# Patient Record
Sex: Male | Born: 2000 | Race: Black or African American | Hispanic: No | Marital: Single | Smoking: Current every day smoker
Health system: Southern US, Community
[De-identification: ages and names within clinical notes are randomized; demographics above are authoritative.]

## PROBLEM LIST (undated history)

## (undated) DIAGNOSIS — Y249XXA Unspecified firearm discharge, undetermined intent, initial encounter: Secondary | ICD-10-CM

## (undated) DIAGNOSIS — I10 Essential (primary) hypertension: Secondary | ICD-10-CM

---

## 2019-02-08 ENCOUNTER — Ambulatory Visit: Payer: Self-pay

## 2019-02-08 NOTE — Telephone Encounter (Signed)
Will try home remedies for nausea, vomiting. Reviewed signs of dehydration. If these occur will go to ED. Reason for Disposition . MILD or MODERATE vomiting (e.g., 1 - 5 times / day)  Answer Assessment - Initial Assessment Questions 1. VOMITING SEVERITY: "How many times have you vomited in the past 24 hours?"     - MILD:  1 - 2 times/day    - MODERATE: 3 - 5 times/day, decreased oral intake without significant weight loss or symptoms of dehydration    - SEVERE: 6 or more times/day, vomits everything or nearly everything, with significant weight loss, symptoms of dehydration      6 2. ONSET: "When did the vomiting begin?"      Started Saturday 3. FLUIDS: "What fluids or food have you vomited up today?" "Have you been able to keep any fluids down?"     No  4. ABDOMINAL PAIN: "Are your having any abdominal pain?" If yes : "How bad is it and what does it feel like?" (e.g., crampy, dull, intermittent, constant)      Yes 5. DIARRHEA: "Is there any diarrhea?" If so, ask: "How many times today?"      No 6. CONTACTS: "Is there anyone else in the family with the same symptoms?"      No 7. CAUSE: "What do you think is causing your vomiting?"     Unsure 8. HYDRATION STATUS: "Any signs of dehydration?" (e.g., dry mouth [not only dry lips], too weak to stand) "When did you last urinate?"     Urine dark yellow 9. OTHER SYMPTOMS: "Do you have any other symptoms?" (e.g., fever, headache, vertigo, vomiting blood or coffee grounds, recent head injury)     No 10. PREGNANCY: "Is there any chance you are pregnant?" "When was your last menstrual period?"       n/a  Protocols used: [facility]

## 2019-02-09 ENCOUNTER — Encounter (HOSPITAL_COMMUNITY): Payer: Self-pay

## 2019-02-09 ENCOUNTER — Emergency Department (HOSPITAL_COMMUNITY)
Admission: EM | Admit: 2019-02-09 | Discharge: 2019-02-09 | Disposition: A | Discharge status: Home / Self Care | Payer: Self-pay | Attending: Emergency Medicine | Admitting: Emergency Medicine

## 2019-02-09 ENCOUNTER — Other Ambulatory Visit: Payer: Self-pay

## 2019-02-09 DIAGNOSIS — F17210 Nicotine dependence, cigarettes, uncomplicated: Secondary | ICD-10-CM | POA: Insufficient documentation

## 2019-02-09 DIAGNOSIS — R112 Nausea with vomiting, unspecified: Secondary | ICD-10-CM | POA: Insufficient documentation

## 2019-02-09 DIAGNOSIS — R1084 Generalized abdominal pain: Secondary | ICD-10-CM | POA: Insufficient documentation

## 2019-02-09 LAB — URINALYSIS, ROUTINE W REFLEX MICROSCOPIC
Bacteria, UA: NONE SEEN
Bilirubin Urine: NEGATIVE
Glucose, UA: NEGATIVE mg/dL
Ketones, ur: 20 mg/dL — AB
Nitrite: NEGATIVE
Protein, ur: 100 mg/dL — AB
Specific Gravity, Urine: 1.026 (ref 1.005–1.030)
pH: 6 (ref 5.0–8.0)

## 2019-02-09 LAB — BASIC METABOLIC PANEL
Anion gap: 14 (ref 5–15)
BUN: 24 mg/dL — ABNORMAL HIGH (ref 6–20)
CO2: 26 mmol/L (ref 22–32)
Calcium: 8.8 mg/dL — ABNORMAL LOW (ref 8.9–10.3)
Chloride: 101 mmol/L (ref 98–111)
Creatinine, Ser: 1.2 mg/dL (ref 0.61–1.24)
GFR calc Af Amer: >60 mL/min (ref >60)
GFR calc non Af Amer: >60 mL/min (ref >60)
Glucose, Bld: 111 mg/dL — ABNORMAL HIGH (ref 70–99)
Potassium: 3.5 mmol/L (ref 3.5–5.1)
Sodium: 141 mmol/L (ref 135–145)

## 2019-02-09 LAB — COMPREHENSIVE METABOLIC PANEL
ALT: 19 U/L (ref 0–44)
AST: 17 U/L (ref 15–41)
Albumin: 5.8 g/dL — ABNORMAL HIGH (ref 3.5–5.0)
Alkaline Phosphatase: 72 U/L (ref 38–126)
Anion gap: 19 — ABNORMAL HIGH (ref 5–15)
BUN: 28 mg/dL — ABNORMAL HIGH (ref 6–20)
CO2: 27 mmol/L (ref 22–32)
Calcium: 9.7 mg/dL (ref 8.9–10.3)
Chloride: 93 mmol/L — ABNORMAL LOW (ref 98–111)
Creatinine, Ser: 1.42 mg/dL — ABNORMAL HIGH (ref 0.61–1.24)
GFR calc Af Amer: >60 mL/min (ref >60)
GFR calc non Af Amer: >60 mL/min (ref >60)
Glucose, Bld: 124 mg/dL — ABNORMAL HIGH (ref 70–99)
Potassium: 3.3 mmol/L — ABNORMAL LOW (ref 3.5–5.1)
Sodium: 139 mmol/L (ref 135–145)
Total Bilirubin: 1.6 mg/dL — ABNORMAL HIGH (ref 0.3–1.2)
Total Protein: 10.2 g/dL — ABNORMAL HIGH (ref 6.5–8.1)

## 2019-02-09 LAB — CBC
HCT: 57.9 % — ABNORMAL HIGH (ref 39.0–52.0)
Hemoglobin: 19.7 g/dL — ABNORMAL HIGH (ref 13.0–17.0)
MCH: 31.5 pg (ref 26.0–34.0)
MCHC: 34 g/dL (ref 30.0–36.0)
MCV: 92.6 fL (ref 80.0–100.0)
Platelets: 300 10*3/uL (ref 150–400)
RBC: 6.25 MIL/uL — ABNORMAL HIGH (ref 4.22–5.81)
RDW: 11.9 % (ref 11.5–15.5)
WBC: 8.9 10*3/uL (ref 4.0–10.5)
nRBC: 0 % (ref 0.0–0.2)

## 2019-02-09 LAB — LIPASE, BLOOD: Lipase: 32 U/L (ref 11–51)

## 2019-02-09 MED ORDER — SODIUM CHLORIDE 0.9 % IV BOLUS
1000.0000 mL | Freq: Once | INTRAVENOUS | Status: AC
  Administered 2019-02-09: 1000 mL via INTRAVENOUS

## 2019-02-09 MED ORDER — POTASSIUM CHLORIDE CRYS ER 20 MEQ PO TBCR
40.0000 meq | EXTENDED_RELEASE_TABLET | Freq: Once | ORAL | Status: AC
  Administered 2019-02-09: 12:00:00 40 meq via ORAL
  Filled 2019-02-09: qty 2

## 2019-02-09 MED ORDER — SODIUM CHLORIDE 0.9 % IV BOLUS
1000.0000 mL | Freq: Once | INTRAVENOUS | Status: AC
  Administered 2019-02-09: 10:00:00 1000 mL via INTRAVENOUS

## 2019-02-09 MED ORDER — PANTOPRAZOLE SODIUM 40 MG IV SOLR
40.0000 mg | Freq: Once | INTRAVENOUS | Status: AC
  Administered 2019-02-09: 10:00:00 40 mg via INTRAVENOUS
  Filled 2019-02-09: qty 40

## 2019-02-09 MED ORDER — ONDANSETRON 4 MG PO TBDP: 4.0000 mg | ORAL_TABLET | Freq: Three times a day (TID) | ORAL | 0 refills | Status: AC | PRN

## 2019-02-09 MED ORDER — ONDANSETRON HCL 4 MG/2ML IJ SOLN
4.0000 mg | Freq: Once | INTRAMUSCULAR | Status: AC
  Administered 2019-02-09: 10:00:00 4 mg via INTRAVENOUS
  Filled 2019-02-09: qty 2

## 2019-02-09 MED ORDER — SODIUM CHLORIDE 0.9% FLUSH: 3.0000 mL | Freq: Once | INTRAVENOUS | Status: DC

## 2019-02-09 MED ORDER — DICYCLOMINE HCL 10 MG/ML IM SOLN
20.0000 mg | Freq: Once | INTRAMUSCULAR | Status: AC
  Administered 2019-02-09: 20 mg via INTRAMUSCULAR
  Filled 2019-02-09: qty 2

## 2019-02-09 MED ORDER — DICYCLOMINE HCL 20 MG PO TABS: 20.0000 mg | ORAL_TABLET | Freq: Two times a day (BID) | ORAL | 0 refills | Status: AC | PRN

## 2019-02-09 NOTE — ED Triage Notes (Signed)
Patient c/o abdominal pain and emesis x 4 days. Patient denies any diarrhea.

## 2019-02-09 NOTE — Discharge Instructions (Signed)
Follow up with your primary care doctor to discuss your hospital visit. Continue to hydrate orally with small sips of fluids throughout the day. Use Zofran as directed for nausea & vomiting.   SEEK IMMEDIATE MEDICAL ATTENTION IF: You begin having localized abdominal pain that does not go away or becomes severe You develop a fever Repeated vomiting occurs (multiple uncontrollable episodes despite medication) or you are unable to keep fluids down Blood is being passed in stools or vomit (bright red or black tarry stools).  New or worsening symptoms develop or you have any additional concerns.

## 2019-02-09 NOTE — ED Notes (Signed)
Pt. Has a urinal at the bedside. Pt. Aware of urine specimen. Nurse aware.

## 2019-02-09 NOTE — ED Provider Notes (Signed)
[place] DEPT Provider Note   CSN: [id number] Arrival date & time: 02/09/19  0848    History   Chief Complaint Chief Complaint  Patient presents with  . Emesis  . Abdominal Pain    HPI Warren Kelley is a 18 y.o. male.     The history is provided by the patient and medical records. No language interpreter was used.  Emesis Associated symptoms: abdominal pain   Associated symptoms: no diarrhea   Abdominal Pain Associated symptoms: nausea and vomiting   Associated symptoms: no constipation and no diarrhea     History reviewed. No pertinent past medical history.  There are no active problems to display for this patient.   History reviewed. No pertinent surgical history.    Warren Kelley is a 18 y.o. male who presents to the Emergency Department complaining of persistent generalized abdominal pain for the last 4 days.  Associated with nausea and vomiting.  Patient states that he has had about 2-3 episodes of emesis a day, mostly after he tries to eat or drink something.  Patient states that this happens to him every couple of months, but typically will last 1 day.  It has been an ongoing issue for him for several years.  He actually states that he has been doing this since he was 18 years old, but has never seen a doctor for it.  He denies any diarrhea, constipation or blood in the stool.  No fever.  No cough, congestion, shortness of breath, chest pain, back pain or urinary symptoms.  He tried to take Pepto-Bismol, but threw it up shortly after.    Home Medications    Prior to Admission medications   Medication Sig Start Date End Date Taking? Authorizing Provider  diphenhydrAMINE (BENADRYL) 25 MG tablet Take 25 mg by mouth every 6 (six) hours as needed for allergies.   Yes [provider]  dicyclomine (BENTYL) 20 MG tablet Take 1 tablet (20 mg total) by mouth 2 (two) times daily as needed (Abdominal pain). 02/09/19   Kayzlee Wirtanen, Ozella Almond, PA-C  ondansetron (ZOFRAN ODT) 4 MG disintegrating tablet Take 1 tablet (4 mg total) by mouth every 8 (eight) hours as needed for nausea or vomiting. 02/09/19   Atleigh Gruen, Ozella Almond, PA-C    Family History Family History  Family history unknown: Yes    Social History Social History   Tobacco Use  . Smoking status: Current Some Day Smoker    Types: Cigarettes  . Smokeless tobacco: Never Used  Substance Use Topics  . Alcohol use: Yes    Comment: occasionally  . Drug use: Yes    Types: Marijuana    Comment: 2-3 days a wek     Allergies   Penicillins, Fish allergy, and Ciprofloxacin   Review of Systems Review of Systems  Gastrointestinal: Positive for abdominal pain, nausea and vomiting. Negative for blood in stool, constipation and diarrhea.  All other systems reviewed and are negative.    Physical Exam Updated Vital Signs BP 135/90   Pulse 69   Temp 98.6 F (37 C) (Oral)   Resp 15   Ht 5\' 6"  (1.676 m)   Wt 70.3 kg   SpO2 100%   BMI 25.02 kg/m   Physical Exam Vitals signs and nursing note reviewed.  Constitutional:      General: He is not in acute distress.    Appearance: He is well-developed.     Comments: Non-toxic appearing.  HENT:  Head: Normocephalic and atraumatic.  Neck:     Musculoskeletal: Neck supple.  Cardiovascular:     Rate and Rhythm: Normal rate and regular rhythm.     Heart sounds: Normal heart sounds. No murmur.  Pulmonary:     Effort: Pulmonary effort is normal. No respiratory distress.     Breath sounds: Normal breath sounds.  Abdominal:     General: There is no distension.     Palpations: Abdomen is soft.     Comments: Mild generalized abdominal tenderness. No rebound or guarding. No focal tenderness at McBurney's. Negative Murphy's. No flank or CVA tenderness.  Skin:    General: Skin is warm and dry.  Neurological:     Mental Status: He is alert and oriented to person, place, and time.      ED Treatments /  Results  Labs (all labs ordered are listed, but only abnormal results are displayed) Labs Reviewed  COMPREHENSIVE METABOLIC PANEL - Abnormal; Notable for the following components:      Result Value   Potassium 3.3 (*)    Chloride 93 (*)    Glucose, Bld 124 (*)    BUN 28 (*)    Creatinine, Ser 1.42 (*)    Total Protein 10.2 (*)    Albumin 5.8 (*)    Total Bilirubin 1.6 (*)    Anion gap 19 (*)    All other components within normal limits  CBC - Abnormal; Notable for the following components:   RBC 6.25 (*)    Hemoglobin 19.7 (*)    HCT 57.9 (*)    All other components within normal limits  URINALYSIS, ROUTINE W REFLEX MICROSCOPIC - Abnormal; Notable for the following components:   Hgb urine dipstick SMALL (*)    Ketones, ur 20 (*)    Protein, ur 100 (*)    Leukocytes,Ua SMALL (*)    All other components within normal limits  BASIC METABOLIC PANEL - Abnormal; Notable for the following components:   Glucose, Bld 111 (*)    BUN 24 (*)    Calcium 8.8 (*)    All other components within normal limits  LIPASE, BLOOD    EKG None  Radiology No results found.  Procedures Procedures (including critical care time)  Medications Ordered in ED Medications  sodium chloride 0.9 % bolus 1,000 mL (0 mLs Intravenous Stopped 02/09/19 1136)  ondansetron (ZOFRAN) injection 4 mg (4 mg Intravenous Given 02/09/19 0958)  dicyclomine (BENTYL) injection 20 mg (20 mg Intramuscular Given 02/09/19 1000)  pantoprazole (PROTONIX) injection 40 mg (40 mg Intravenous Given 02/09/19 1004)  sodium chloride 0.9 % bolus 1,000 mL (0 mLs Intravenous Stopped 02/09/19 1321)  potassium chloride SA (K-DUR) CR tablet 40 mEq (40 mEq Oral Given 02/09/19 1147)     Initial Impression / Assessment and Plan / ED Course  I have reviewed the triage vital signs and the nursing notes.  Pertinent labs & imaging results that were available during my care of the patient were reviewed by me and considered in my medical decision  making (see chart for details).       Warren Kelley is a 18 y.o. male who presents to ED for nausea and vomiting for the last 4 days.  On exam, patient is afebrile, hemodynamically stable with mild generalized abdominal tenderness with no focality, rebound or guarding.  Labs reviewed: Hypokalemia which was replenished in ED.  Appears quite dehydrated with anion gap of 19 and AKI with cr at 1.42 as well.  Given  2 L of fluid.  Chemistry panel rechecked and much improved with normal creatinine and anion gap. On re-evaluation, patient feels much improved and is now tolerating PO. Repeat abdominal exam non-surgical. PCP follow up encouraged. Home care instructions and return precautions discussed. All questions answered.    Final Clinical Impressions(s) / ED Diagnoses   Final diagnoses:  Non-intractable vomiting with nausea, unspecified vomiting type    ED Discharge Orders         Ordered    ondansetron (ZOFRAN ODT) 4 MG disintegrating tablet  Every 8 hours PRN     02/09/19 1434    dicyclomine (BENTYL) 20 MG tablet  2 times daily PRN     02/09/19 1434           Aj Crunkleton, Chase PicketJaime Pilcher, PA-C 02/09/19 1442    Samuel JesterMcManus, Kathleen, DO 02/10/19 [id number]

## 2019-02-10 ENCOUNTER — Other Ambulatory Visit: Payer: Self-pay

## 2019-02-10 ENCOUNTER — Encounter (HOSPITAL_COMMUNITY): Payer: Self-pay | Admitting: Emergency Medicine

## 2019-02-10 ENCOUNTER — Emergency Department (HOSPITAL_COMMUNITY)
Admission: EM | Admit: 2019-02-10 | Discharge: 2019-02-11 | Disposition: A | Discharge status: Home / Self Care | Payer: Self-pay | Attending: Emergency Medicine | Admitting: Emergency Medicine

## 2019-02-10 DIAGNOSIS — F1210 Cannabis abuse, uncomplicated: Secondary | ICD-10-CM | POA: Insufficient documentation

## 2019-02-10 DIAGNOSIS — F17210 Nicotine dependence, cigarettes, uncomplicated: Secondary | ICD-10-CM | POA: Insufficient documentation

## 2019-02-10 DIAGNOSIS — R197 Diarrhea, unspecified: Secondary | ICD-10-CM | POA: Insufficient documentation

## 2019-02-10 DIAGNOSIS — R112 Nausea with vomiting, unspecified: Secondary | ICD-10-CM | POA: Insufficient documentation

## 2019-02-10 LAB — COMPREHENSIVE METABOLIC PANEL
ALT: 17 U/L (ref 0–44)
AST: 22 U/L (ref 15–41)
Albumin: 5.1 g/dL — ABNORMAL HIGH (ref 3.5–5.0)
Alkaline Phosphatase: 67 U/L (ref 38–126)
Anion gap: 11 (ref 5–15)
BUN: 19 mg/dL (ref 6–20)
CO2: 27 mmol/L (ref 22–32)
Calcium: 9.9 mg/dL (ref 8.9–10.3)
Chloride: 100 mmol/L (ref 98–111)
Creatinine, Ser: 1.09 mg/dL (ref 0.61–1.24)
GFR calc Af Amer: >60 mL/min (ref >60)
GFR calc non Af Amer: >60 mL/min (ref >60)
Glucose, Bld: 119 mg/dL — ABNORMAL HIGH (ref 70–99)
Potassium: 3.8 mmol/L (ref 3.5–5.1)
Sodium: 138 mmol/L (ref 135–145)
Total Bilirubin: 1.4 mg/dL — ABNORMAL HIGH (ref 0.3–1.2)
Total Protein: 9 g/dL — ABNORMAL HIGH (ref 6.5–8.1)

## 2019-02-10 LAB — CBC
HCT: 53.5 % — ABNORMAL HIGH (ref 39.0–52.0)
Hemoglobin: 18.1 g/dL — ABNORMAL HIGH (ref 13.0–17.0)
MCH: 31.7 pg (ref 26.0–34.0)
MCHC: 33.8 g/dL (ref 30.0–36.0)
MCV: 93.7 fL (ref 80.0–100.0)
Platelets: 281 10*3/uL (ref 150–400)
RBC: 5.71 MIL/uL (ref 4.22–5.81)
RDW: 11.9 % (ref 11.5–15.5)
WBC: 10.6 10*3/uL — ABNORMAL HIGH (ref 4.0–10.5)
nRBC: 0 % (ref 0.0–0.2)

## 2019-02-10 LAB — LIPASE, BLOOD: Lipase: 49 U/L (ref 11–51)

## 2019-02-10 MED ORDER — ALUM & MAG HYDROXIDE-SIMETH 200-200-20 MG/5ML PO SUSP
30.0000 mL | Freq: Once | ORAL | Status: AC
  Administered 2019-02-11: 30 mL via ORAL
  Filled 2019-02-10: qty 30

## 2019-02-10 MED ORDER — SODIUM CHLORIDE 0.9% FLUSH: 3.0000 mL | Freq: Once | INTRAVENOUS | Status: DC

## 2019-02-10 MED ORDER — DIPHENHYDRAMINE HCL 50 MG/ML IJ SOLN
25.0000 mg | Freq: Once | INTRAMUSCULAR | Status: AC
  Administered 2019-02-11: 25 mg via INTRAMUSCULAR
  Filled 2019-02-10: qty 1

## 2019-02-10 MED ORDER — PROCHLORPERAZINE EDISYLATE 10 MG/2ML IJ SOLN
10.0000 mg | Freq: Once | INTRAMUSCULAR | Status: AC
  Administered 2019-02-11: 10 mg via INTRAMUSCULAR
  Filled 2019-02-10: qty 2

## 2019-02-10 NOTE — ED Triage Notes (Addendum)
Patient here from home with complaints of n/v 6 days. Denies abd pain. Reports that he was seen yesterday for same. Hx of parasitic infection.

## 2019-02-11 LAB — URINALYSIS, ROUTINE W REFLEX MICROSCOPIC
Bilirubin Urine: NEGATIVE
Glucose, UA: NEGATIVE mg/dL
Ketones, ur: 5 mg/dL — AB
Nitrite: NEGATIVE
Protein, ur: 100 mg/dL — AB
Specific Gravity, Urine: 1.024 (ref 1.005–1.030)
pH: 6 (ref 5.0–8.0)

## 2019-02-11 MED ORDER — PROMETHAZINE HCL 25 MG RE SUPP: 25.0000 mg | Freq: Four times a day (QID) | RECTAL | 0 refills | Status: AC | PRN

## 2019-02-11 NOTE — ED Provider Notes (Signed)
[facility]-EMERGENCY DEPT Provider Note   CSN: [id number] Arrival date & time: 02/10/19  2116    History   Chief Complaint Chief Complaint  Patient presents with  . Nausea  . Emesis    HPI Warren Kelley is a 18 y.o. male.     18 yo M with a chief complaint of nausea and vomiting.  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  Mom states that he has had about 6 times this year and this time suction been going on for about 6 days.  Having trouble keeping anything down.  Having some diarrhea with it as well.  Has lost about 6 pounds this week.  No fevers no abdominal pain.  No blood in his stool.  Was seen in the emergency department yesterday and had an acute kidney injury received 2 L of IV fluids with improvement was discharged home.  Patient been taking his Zofran but without improvement.  The history is provided by the patient.  Emesis Severity:  Moderate Duration:  1 week Timing:  Constant Quality:  Stomach contents Able to tolerate:  Liquids Progression:  Worsening Chronicity:  Recurrent Recent urination:  Normal Relieved by:  Nothing Worsened by:  Nothing Ineffective treatments:  None tried Associated symptoms: no abdominal pain, no arthralgias, no chills, no diarrhea, no fever, no headaches and no myalgias     History reviewed. No pertinent past medical history.  There are no active problems to display for this patient.   History reviewed. No pertinent surgical history.      Home Medications    Prior to Admission medications   Medication Sig Start Date End Date Taking? Authorizing Provider  diphenhydrAMINE (BENADRYL) 25 MG tablet Take 25 mg by mouth every 6 (six) hours as needed for allergies.   Yes [provider]  dicyclomine (BENTYL) 20 MG tablet Take 1 tablet (20 mg total) by mouth 2 (two) times daily as needed (Abdominal pain). 02/09/19   Ward,  Chase PicketJaime Pilcher, PA-C  ondansetron (ZOFRAN ODT) 4 MG disintegrating tablet Take 1 tablet (4 mg total) by mouth every 8 (eight) hours as needed for nausea or vomiting. 02/09/19   Ward, Chase PicketJaime Pilcher, PA-C  promethazine (PHENERGAN) 25 MG suppository Place 1 suppository (25 mg total) rectally every 6 (six) hours as needed for nausea or vomiting. 02/11/19   Melene PlanFloyd, Ladarius Seubert, DO    Family History Family History  Family history unknown: Yes    Social History Social History   Tobacco Use  . Smoking status: Current Some Day Smoker    Types: Cigarettes  . Smokeless tobacco: Never Used  Substance Use Topics  . Alcohol use: Yes    Comment: occasionally  . Drug use: Yes    Types: Marijuana    Comment: 2-3 days a wek     Allergies   Penicillins, Fish allergy, and Ciprofloxacin   Review of Systems Review of Systems  Constitutional: Negative for chills and fever.  HENT: Negative for congestion and facial swelling.   Eyes: Negative for discharge and visual disturbance.  Respiratory: Negative for shortness of breath.   Cardiovascular: Negative for chest pain and palpitations.  Gastrointestinal: Positive for vomiting. Negative for abdominal pain and diarrhea.  Musculoskeletal: Negative for arthralgias and myalgias.  Skin: Negative for color change and rash.  Neurological: Negative for tremors, syncope and headaches.  Psychiatric/Behavioral: Negative for confusion and dysphoric mood.  Physical Exam Updated Vital Signs BP (!) 143/109 (BP Location: Left Arm)   Pulse 84   Temp 99.1 F (37.3 C) (Oral)   Resp 20   SpO2 99%   Physical Exam Vitals signs and nursing note reviewed.  Constitutional:      Appearance: He is well-developed.  HENT:     Head: Normocephalic and atraumatic.  Eyes:     Pupils: Pupils are equal, round, and reactive to light.  Neck:     Musculoskeletal: Normal range of motion and neck supple.     Vascular: No JVD.  Cardiovascular:     Rate and Rhythm: Normal rate  and regular rhythm.     Heart sounds: No murmur. No friction rub. No gallop.   Pulmonary:     Effort: No respiratory distress.     Breath sounds: No wheezing.  Abdominal:     General: There is no distension.     Tenderness: There is no guarding or rebound.  Musculoskeletal: Normal range of motion.  Skin:    Coloration: Skin is not pale.     Findings: No rash.  Neurological:     Mental Status: He is alert and oriented to person, place, and time.  Psychiatric:        Behavior: Behavior normal.      ED Treatments / Results  Labs (all labs ordered are listed, but only abnormal results are displayed) Labs Reviewed  COMPREHENSIVE METABOLIC PANEL - Abnormal; Notable for the following components:      Result Value   Glucose, Bld 119 (*)    Total Protein 9.0 (*)    Albumin 5.1 (*)    Total Bilirubin 1.4 (*)    All other components within normal limits  CBC - Abnormal; Notable for the following components:   WBC 10.6 (*)    Hemoglobin 18.1 (*)    HCT 53.5 (*)    All other components within normal limits  URINALYSIS, ROUTINE W REFLEX MICROSCOPIC - Abnormal; Notable for the following components:   Color, Urine AMBER (*)    Hgb urine dipstick SMALL (*)    Ketones, ur 5 (*)    Protein, ur 100 (*)    Leukocytes,Ua TRACE (*)    Bacteria, UA RARE (*)    All other components within normal limits  LIPASE, BLOOD    EKG None  Radiology No results found.  Procedures Procedures (including critical care time)  Medications Ordered in ED Medications  sodium chloride flush (NS) 0.9 % injection 3 mL (0 mLs Intravenous Hold 02/10/19 2300)  prochlorperazine (COMPAZINE) injection 10 mg (10 mg Intramuscular Given 02/11/19 0005)  diphenhydrAMINE (BENADRYL) injection 25 mg (25 mg Intramuscular Given 02/11/19 0005)  alum & mag hydroxide-simeth (MAALOX/MYLANTA) 200-200-20 MG/5ML suspension 30 mL (30 mLs Oral Given 02/11/19 0004)     Initial Impression / Assessment and Plan / ED Course  I  have reviewed the triage vital signs and the nursing notes.  Pertinent labs & imaging results that were available during my care of the patient were reviewed by me and considered in my medical decision making (see chart for details).        18 yo M with a chief complaints of nausea vomiting and diarrhea.  Going for the past week.  Patient was seen yesterday with an acute kidney injury given 2 L of fluids with improvement.  Today his renal function is continue to be improved.  He is well-appearing nontoxic.  Will give Compazine Benadryl oral trial and  reassess.  Able to tolerate by mouth without difficulty.  Nausea significantly improved.  Discharge home.  Trial of Phenergan suppositories.  12:37 AM:  I have discussed the diagnosis/risks/treatment options with the patient and believe the pt to be eligible for discharge home to follow-up with GI. We also discussed returning to the ED immediately if new or worsening sx occur. We discussed the sx which are most concerning (e.g., sudden worsening pain, fever, inability to tolerate by mouth) that necessitate immediate return. Medications administered to the patient during their visit and any new prescriptions provided to the patient are listed below.  Medications given during this visit Medications  sodium chloride flush (NS) 0.9 % injection 3 mL (0 mLs Intravenous Hold 02/10/19 2300)  prochlorperazine (COMPAZINE) injection 10 mg (10 mg Intramuscular Given 02/11/19 0005)  diphenhydrAMINE (BENADRYL) injection 25 mg (25 mg Intramuscular Given 02/11/19 0005)  alum & mag hydroxide-simeth (MAALOX/MYLANTA) 200-200-20 MG/5ML suspension 30 mL (30 mLs Oral Given 02/11/19 0004)     The patient appears reasonably screen and/or stabilized for discharge and I doubt any other medical condition or other [facility] requiring further screening, evaluation, or treatment in the ED at this time prior to discharge.    Final Clinical Impressions(s) / ED Diagnoses   Final  diagnoses:  Nausea vomiting and diarrhea    ED Discharge Orders         Ordered    promethazine (PHENERGAN) 25 MG suppository  Every 6 hours PRN     02/11/19 0033           Melene PlanFloyd, Nylan Nevel, DO 02/11/19 0037

## 2019-02-11 NOTE — Discharge Instructions (Signed)
Try zantac or pepcid twice a day.  Try to avoid things that may make this worse, most commonly these are spicy foods tomato based products fatty foods chocolate and peppermint.  Alcohol and tobacco can also make this worse.  Return to the emergency department for sudden worsening pain fever or inability to eat or drink. ° °

## 2019-02-11 NOTE — ED Notes (Signed)
Patient was able to keep down Maalox

## 2019-10-06 ENCOUNTER — Other Ambulatory Visit: Payer: Self-pay

## 2019-10-06 ENCOUNTER — Emergency Department (HOSPITAL_COMMUNITY)
Admission: EM | Admit: 2019-10-06 | Discharge: 2019-10-06 | Disposition: A | Discharge status: Home / Self Care | Payer: No Typology Code available for payment source | Attending: Emergency Medicine | Admitting: Emergency Medicine

## 2019-10-06 ENCOUNTER — Emergency Department (HOSPITAL_COMMUNITY): Payer: No Typology Code available for payment source

## 2019-10-06 DIAGNOSIS — Y999 Unspecified external cause status: Secondary | ICD-10-CM | POA: Diagnosis not present

## 2019-10-06 DIAGNOSIS — R52 Pain, unspecified: Secondary | ICD-10-CM | POA: Diagnosis present

## 2019-10-06 DIAGNOSIS — Z79899 Other long term (current) drug therapy: Secondary | ICD-10-CM | POA: Insufficient documentation

## 2019-10-06 DIAGNOSIS — F17210 Nicotine dependence, cigarettes, uncomplicated: Secondary | ICD-10-CM | POA: Diagnosis not present

## 2019-10-06 DIAGNOSIS — M545 Low back pain, unspecified: Secondary | ICD-10-CM

## 2019-10-06 DIAGNOSIS — R0789 Other chest pain: Secondary | ICD-10-CM

## 2019-10-06 DIAGNOSIS — Y92410 Unspecified street and highway as the place of occurrence of the external cause: Secondary | ICD-10-CM | POA: Insufficient documentation

## 2019-10-06 MED ORDER — IBUPROFEN 600 MG PO TABS: 600.0000 mg | ORAL_TABLET | Freq: Four times a day (QID) | ORAL | 0 refills | Status: AC | PRN

## 2019-10-06 MED ORDER — CYCLOBENZAPRINE HCL 10 MG PO TABS
10.0000 mg | ORAL_TABLET | Freq: Once | ORAL | Status: AC
  Administered 2019-10-06: 09:00:00 10 mg via ORAL
  Filled 2019-10-06: qty 1

## 2019-10-06 MED ORDER — IBUPROFEN 200 MG PO TABS
600.0000 mg | ORAL_TABLET | Freq: Once | ORAL | Status: AC
  Administered 2019-10-06: 600 mg via ORAL
  Filled 2019-10-06: qty 3

## 2019-10-06 MED ORDER — CYCLOBENZAPRINE HCL 10 MG PO TABS: 10.0000 mg | ORAL_TABLET | Freq: Two times a day (BID) | ORAL | 0 refills | Status: AC | PRN
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
## 2019-10-06 NOTE — Discharge Instructions (Signed)
Alternate 600 mg of ibuprofen and 500-1000 mg of Tylenol every 3-4 hours as needed for pain.  Take ibuprofen with food to avoid upset stomach issues.  Do not exceed 4000 mg of Tylenol daily.  You may take Flexeril up to twice daily as needed for muscle spasms. This medication may make you drowsy, so I typically only recommended only taking at night. If this medication makes you drowsy throughout the day, no driving, drinking alcohol, or operating heavy machinery. You may also cut these tablets in half if they are too strong.   Apply ice or heat, whichever feels best, to areas of soreness 20 minutes at a time 3-4 times daily.  Do some gentle stretching throughout the day, especially during or after hot showers or baths. Take short frequent walks and avoid prolonged periods of sitting or laying.   Expect to be sore for the next few day and follow up with primary care physician for recheck of ongoing symptoms (especially if symptoms persist longer than 1 week) but return to ER for emergent changing or worsening of symptoms such as severe headache that gets worse, altered mental status/behaving unusually, persistent vomiting, excessive drowsiness, numbness to the arms or legs, unsteady gait, or slurred speech.  
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
## 2022-01-15 ENCOUNTER — Emergency Department (HOSPITAL_COMMUNITY): Payer: Medicaid Other

## 2022-01-15 ENCOUNTER — Emergency Department (HOSPITAL_COMMUNITY)
Admission: EM | Admit: 2022-01-15 | Discharge: 2022-01-16 | Disposition: A | Discharge status: Home / Self Care | Payer: Medicaid Other | Attending: Emergency Medicine | Admitting: Emergency Medicine

## 2022-01-15 ENCOUNTER — Other Ambulatory Visit: Payer: Self-pay

## 2022-01-15 ENCOUNTER — Encounter (HOSPITAL_COMMUNITY): Payer: Self-pay | Admitting: Surgery

## 2022-01-15 DIAGNOSIS — Z23 Encounter for immunization: Secondary | ICD-10-CM | POA: Diagnosis not present

## 2022-01-15 DIAGNOSIS — W3400XA Accidental discharge from unspecified firearms or gun, initial encounter: Secondary | ICD-10-CM | POA: Insufficient documentation

## 2022-01-15 DIAGNOSIS — R000 Tachycardia, unspecified: Secondary | ICD-10-CM | POA: Diagnosis not present

## 2022-01-15 DIAGNOSIS — S299XXA Unspecified injury of thorax, initial encounter: Secondary | ICD-10-CM | POA: Diagnosis present

## 2022-01-15 DIAGNOSIS — Z20822 Contact with and (suspected) exposure to covid-19: Secondary | ICD-10-CM | POA: Insufficient documentation

## 2022-01-15 MED ORDER — TETANUS-DIPHTH-ACELL PERTUSSIS 5-2.5-18.5 LF-MCG/0.5 IM SUSY
0.5000 mL | PREFILLED_SYRINGE | Freq: Once | INTRAMUSCULAR | Status: AC
  Administered 2022-01-16: 0.5 mL via INTRAMUSCULAR
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
## 2022-01-15 NOTE — ED Notes (Signed)
Zofran and Morphine given by Fredrich Romans RN
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
## 2022-01-15 NOTE — ED Notes (Signed)
Per MD Freida Busman no labs to be collected prior to pt taken to CT - Pt taken to CT with RN at this time

## 2022-01-15 NOTE — ED Notes (Signed)
Verbal for 4 of morphine and 4 of zofran by MD Horton

## 2022-01-16 ENCOUNTER — Encounter (HOSPITAL_COMMUNITY): Payer: Self-pay | Admitting: Emergency Medicine

## 2022-01-16 LAB — URINALYSIS, ROUTINE W REFLEX MICROSCOPIC
Bacteria, UA: NONE SEEN
Bilirubin Urine: NEGATIVE
Glucose, UA: NEGATIVE mg/dL
Hgb urine dipstick: NEGATIVE
Ketones, ur: NEGATIVE mg/dL
Nitrite: NEGATIVE
Protein, ur: NEGATIVE mg/dL
Specific Gravity, Urine: 1.013 (ref 1.005–1.030)
pH: 7 (ref 5.0–8.0)

## 2022-01-16 LAB — COMPREHENSIVE METABOLIC PANEL
ALT: 18 U/L (ref 0–44)
AST: 34 U/L (ref 15–41)
Albumin: 3.9 g/dL (ref 3.5–5.0)
Alkaline Phosphatase: 52 U/L (ref 38–126)
Anion gap: 11 (ref 5–15)
BUN: 10 mg/dL (ref 6–20)
CO2: 21 mmol/L — ABNORMAL LOW (ref 22–32)
Calcium: 8.5 mg/dL — ABNORMAL LOW (ref 8.9–10.3)
Chloride: 109 mmol/L (ref 98–111)
Creatinine, Ser: 1.4 mg/dL — ABNORMAL HIGH (ref 0.61–1.24)
GFR, Estimated: >60 mL/min (ref >60)
Glucose, Bld: 88 mg/dL (ref 70–99)
Potassium: 3.3 mmol/L — ABNORMAL LOW (ref 3.5–5.1)
Sodium: 141 mmol/L (ref 135–145)
Total Bilirubin: 0.8 mg/dL (ref 0.3–1.2)
Total Protein: 6 g/dL — ABNORMAL LOW (ref 6.5–8.1)

## 2022-01-16 LAB — SAMPLE TO BLOOD BANK

## 2022-01-16 LAB — PROTIME-INR
INR: 1.1 (ref 0.8–1.2)
Prothrombin Time: 14.5 seconds (ref 11.4–15.2)

## 2022-01-16 LAB — CBC
HCT: 52.8 % — ABNORMAL HIGH (ref 39.0–52.0)
Hemoglobin: 17.7 g/dL — ABNORMAL HIGH (ref 13.0–17.0)
MCH: 34.4 pg — ABNORMAL HIGH (ref 26.0–34.0)
MCHC: 33.5 g/dL (ref 30.0–36.0)
MCV: 102.7 fL — ABNORMAL HIGH (ref 80.0–100.0)
Platelets: 224 10*3/uL (ref 150–400)
RBC: 5.14 MIL/uL (ref 4.22–5.81)
RDW: 13 % (ref 11.5–15.5)
WBC: 8.1 10*3/uL (ref 4.0–10.5)
nRBC: 0 % (ref 0.0–0.2)

## 2022-01-16 LAB — I-STAT CHEM 8, ED
BUN: 11 mg/dL (ref 6–20)
Calcium, Ion: 0.93 mmol/L — ABNORMAL LOW (ref 1.15–1.40)
Chloride: 107 mmol/L (ref 98–111)
Creatinine, Ser: 1.5 mg/dL — ABNORMAL HIGH (ref 0.61–1.24)
Glucose, Bld: 83 mg/dL (ref 70–99)
HCT: 53 % — ABNORMAL HIGH (ref 39.0–52.0)
Hemoglobin: 18 g/dL — ABNORMAL HIGH (ref 13.0–17.0)
Potassium: 3.1 mmol/L — ABNORMAL LOW (ref 3.5–5.1)
Sodium: 143 mmol/L (ref 135–145)
TCO2: 21 mmol/L — ABNORMAL LOW (ref 22–32)

## 2022-01-16 LAB — LACTIC ACID, PLASMA: Lactic Acid, Venous: 3.6 mmol/L (ref 0.5–1.9)

## 2022-01-16 LAB — ETHANOL: Alcohol, Ethyl (B): 185 mg/dL — ABNORMAL HIGH (ref <10)

## 2022-01-16 LAB — RESP PANEL BY RT-PCR (FLU A&B, COVID) ARPGX2
Influenza A by PCR: NEGATIVE
Influenza B by PCR: NEGATIVE
SARS Coronavirus 2 by RT PCR: NEGATIVE

## 2022-01-16 MED ORDER — CEPHALEXIN 500 MG PO CAPS: 500.0000 mg | ORAL_CAPSULE | Freq: Four times a day (QID) | ORAL | 0 refills | Status: AC

## 2022-01-16 MED ORDER — ONDANSETRON HCL 4 MG/2ML IJ SOLN
4.0000 mg | Freq: Once | INTRAMUSCULAR | Status: AC
  Administered 2022-01-15: 4 mg via INTRAVENOUS

## 2022-01-16 MED ORDER — POTASSIUM CHLORIDE CRYS ER 20 MEQ PO TBCR
40.0000 meq | EXTENDED_RELEASE_TABLET | Freq: Once | ORAL | Status: AC
Start: 2022-01-16 — End: 2022-01-16
  Administered 2022-01-16: 40 meq via ORAL
  Filled 2022-01-16: qty 2

## 2022-01-16 MED ORDER — MORPHINE SULFATE (PF) 4 MG/ML IV SOLN
4.0000 mg | Freq: Once | INTRAVENOUS | Status: DC
  Filled 2022-01-16: qty 1

## 2022-01-16 MED ORDER — MORPHINE SULFATE (PF) 4 MG/ML IV SOLN
4.0000 mg | Freq: Once | INTRAVENOUS | Status: AC
  Administered 2022-01-15: 4 mg via INTRAVENOUS

## 2022-01-16 MED ORDER — IOHEXOL 350 MG/ML SOLN
100.0000 mL | Freq: Once | INTRAVENOUS | Status: AC | PRN
  Administered 2022-01-15: 100 mL via INTRAVENOUS

## 2022-01-16 MED ORDER — SODIUM CHLORIDE 0.9 % IV BOLUS
1000.0000 mL | Freq: Once | INTRAVENOUS | Status: AC
  Administered 2022-01-16: 1000 mL via INTRAVENOUS

## 2022-01-16 NOTE — ED Notes (Signed)
PT given water and phone to call his mother per request

## 2022-01-16 NOTE — ED Notes (Signed)
Pt mother - [phone] - Pt would like her to be called

## 2022-01-16 NOTE — Progress Notes (Signed)
   01/15/22 2322  Clinical Encounter Type  Visited With Patient not available  Visit Type Initial;Trauma  Referral From Nurse  Consult/Referral To Chaplain   Chaplain Tery Sanfilippo responded. The patient is being attended to by the medical team. There is no support person present. Chaplain remains available for additional support as needed. This note was prepared by Deneen Harts, M.Div..  For questions please contact by phone [phone].

## 2022-01-16 NOTE — ED Notes (Signed)
Patient verbalizes understanding of discharge instructions. Opportunity for questioning and answers were provided. Armband removed by staff, pt discharged from ED ambulatory.   
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
## 2022-01-16 NOTE — ED Notes (Signed)
MD Horton made aware of pt critical lactic
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
## 2022-01-16 NOTE — ED Notes (Signed)
MD Horton notified pt tachy up into the 150s - EKG to be obtained and meds to be ordered
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
## 2022-01-16 NOTE — ED Notes (Signed)
CSI at bedside.

## 2022-07-19 ENCOUNTER — Other Ambulatory Visit (HOSPITAL_BASED_OUTPATIENT_CLINIC_OR_DEPARTMENT_OTHER): Payer: Self-pay

## 2022-07-19 ENCOUNTER — Emergency Department (HOSPITAL_BASED_OUTPATIENT_CLINIC_OR_DEPARTMENT_OTHER)
Admission: EM | Admit: 2022-07-19 | Discharge: 2022-07-19 | Disposition: A | Discharge status: Home / Self Care | Payer: Medicaid Other | Attending: Emergency Medicine | Admitting: Emergency Medicine

## 2022-07-19 ENCOUNTER — Encounter (HOSPITAL_BASED_OUTPATIENT_CLINIC_OR_DEPARTMENT_OTHER): Payer: Self-pay

## 2022-07-19 DIAGNOSIS — J029 Acute pharyngitis, unspecified: Secondary | ICD-10-CM | POA: Diagnosis present

## 2022-07-19 DIAGNOSIS — J039 Acute tonsillitis, unspecified: Secondary | ICD-10-CM | POA: Insufficient documentation

## 2022-07-19 DIAGNOSIS — Z20822 Contact with and (suspected) exposure to covid-19: Secondary | ICD-10-CM | POA: Insufficient documentation

## 2022-07-19 LAB — BASIC METABOLIC PANEL
Anion gap: 11 (ref 5–15)
BUN: 8 mg/dL (ref 6–20)
CO2: 29 mmol/L (ref 22–32)
Calcium: 9.6 mg/dL (ref 8.9–10.3)
Chloride: 99 mmol/L (ref 98–111)
Creatinine, Ser: 1.16 mg/dL (ref 0.61–1.24)
GFR, Estimated: >60 mL/min (ref >60)
Glucose, Bld: 108 mg/dL — ABNORMAL HIGH (ref 70–99)
Potassium: 3.7 mmol/L (ref 3.5–5.1)
Sodium: 139 mmol/L (ref 135–145)

## 2022-07-19 LAB — RESP PANEL BY RT-PCR (FLU A&B, COVID) ARPGX2
Influenza A by PCR: NEGATIVE
Influenza B by PCR: NEGATIVE
SARS Coronavirus 2 by RT PCR: NEGATIVE

## 2022-07-19 LAB — MONONUCLEOSIS SCREEN: Mono Screen: NEGATIVE

## 2022-07-19 LAB — CBC WITH DIFFERENTIAL/PLATELET
Abs Immature Granulocytes: 0.03 10*3/uL (ref 0.00–0.07)
Basophils Absolute: 0 10*3/uL (ref 0.0–0.1)
Basophils Relative: 0 %
Eosinophils Absolute: 0 10*3/uL (ref 0.0–0.5)
Eosinophils Relative: 0 %
HCT: 47.1 % (ref 39.0–52.0)
Hemoglobin: 16.2 g/dL (ref 13.0–17.0)
Immature Granulocytes: 0 %
Lymphocytes Relative: 8 %
Lymphs Abs: 1 10*3/uL (ref 0.7–4.0)
MCH: 34.2 pg — ABNORMAL HIGH (ref 26.0–34.0)
MCHC: 34.4 g/dL (ref 30.0–36.0)
MCV: 99.4 fL (ref 80.0–100.0)
Monocytes Absolute: 1.4 10*3/uL — ABNORMAL HIGH (ref 0.1–1.0)
Monocytes Relative: 11 %
Neutro Abs: 10 10*3/uL — ABNORMAL HIGH (ref 1.7–7.7)
Neutrophils Relative %: 81 %
Platelets: 220 10*3/uL (ref 150–400)
RBC: 4.74 MIL/uL (ref 4.22–5.81)
RDW: 12.7 % (ref 11.5–15.5)
WBC: 12.5 10*3/uL — ABNORMAL HIGH (ref 4.0–10.5)
nRBC: 0 % (ref 0.0–0.2)

## 2022-07-19 LAB — GROUP A STREP BY PCR: Group A Strep by PCR: NOT DETECTED

## 2022-07-19 MED ORDER — CLINDAMYCIN HCL 150 MG PO CAPS
450.0000 mg | ORAL_CAPSULE | Freq: Three times a day (TID) | ORAL | 0 refills | Status: AC
  Filled 2022-07-19: qty 90, 10d supply, fill #0

## 2022-07-19 MED ORDER — DEXAMETHASONE 4 MG PO TABS
8.0000 mg | ORAL_TABLET | Freq: Once | ORAL | Status: AC
  Administered 2022-07-19: 8 mg via ORAL
  Filled 2022-07-19: qty 2

## 2022-07-19 MED ORDER — CLINDAMYCIN HCL 150 MG PO CAPS: 450.0000 mg | ORAL_CAPSULE | Freq: Three times a day (TID) | ORAL | 0 refills | Status: DC

## 2022-07-19 MED ORDER — CLINDAMYCIN HCL 150 MG PO CAPS
450.0000 mg | ORAL_CAPSULE | Freq: Once | ORAL | Status: AC
  Administered 2022-07-19: 450 mg via ORAL
  Filled 2022-07-19: qty 3

## 2022-07-19 MED ORDER — ACETAMINOPHEN 500 MG PO TABS
1000.0000 mg | ORAL_TABLET | Freq: Once | ORAL | Status: AC
  Administered 2022-07-19: 1000 mg via ORAL
  Filled 2022-07-19: qty 2

## 2022-07-19 NOTE — ED Provider Notes (Signed)
[facility] EMERGENCY DEPT Provider Note   CSN: [id number] Arrival date & time: 07/19/22  6861     History  Chief Complaint  Patient presents with   Sore Throat    Warren Kelley is a 21 y.o. male.   Sore Throat     21 year old male presenting to the emergency department with a sore throat.  The patient states that he has had a sore throat for the last 2 days.  He denies any fevers or chills.  He is tolerating oral intake.  He is able to range his neck and able to open his mouth.  He has no known sick contacts.  He feels that the swelling is more on the left than the right.  He states that it hurts to swallow.  Home Medications Prior to Admission medications   Medication Sig Start Date End Date Taking? Authorizing Provider  clindamycin (CLEOCIN) 150 MG capsule Take 3 capsules (450 mg total) by mouth 3 (three) times daily for 10 days. 07/19/22 07/29/22 Yes Ernie Avena, MD  cephALEXin (KEFLEX) 500 MG capsule Take 1 capsule (500 mg total) by mouth 4 (four) times daily. 01/16/22   Horton, Mayer Masker, MD  cyclobenzaprine (FLEXERIL) 10 MG tablet Take 1 tablet (10 mg total) by mouth 2 (two) times daily as needed for muscle spasms. 10/06/19   Fawze, Mina A, PA-C  dicyclomine (BENTYL) 20 MG tablet Take 1 tablet (20 mg total) by mouth 2 (two) times daily as needed (Abdominal pain). Patient not taking: Reported on 10/06/2019 02/09/19   Ward, Chase Picket, PA-C  ibuprofen (ADVIL) 600 MG tablet Take 1 tablet (600 mg total) by mouth every 6 (six) hours as needed. 10/06/19   Luevenia Maxin, Mina A, PA-C  ondansetron (ZOFRAN ODT) 4 MG disintegrating tablet Take 1 tablet (4 mg total) by mouth every 8 (eight) hours as needed for nausea or vomiting. Patient not taking: Reported on 10/06/2019 02/09/19   Ward, Chase Picket, PA-C  promethazine (PHENERGAN) 25 MG suppository Place 1 suppository (25 mg total) rectally every 6 (six) hours as needed for nausea or vomiting. Patient not taking:  Reported on 10/06/2019 02/11/19   Melene Plan, DO      Allergies    Penicillins, Ciprofloxacin, Fish allergy, Penicillins, and Ciprofloxacin    Review of Systems   Review of Systems  All other systems reviewed and are negative.   Physical Exam Updated Vital Signs BP (!) 151/102 (BP Location: Right Arm)   Pulse 99   Temp 99 F (37.2 C)   Resp 16   SpO2 100%  Physical Exam Vitals and nursing note reviewed.  Constitutional:      General: He is not in acute distress. HENT:     Head: Normocephalic and atraumatic.     Mouth/Throat:     Lips: Pink.     Mouth: Mucous membranes are moist.     Tongue: No lesions.     Palate: No mass and lesions.     Pharynx: Posterior oropharyngeal erythema present. No pharyngeal swelling.     Tonsils: No tonsillar exudate or tonsillar abscesses. 0 on the right. 1+ on the left.     Comments: Left-sided peritonsillar swelling which could reflect developing phlegmon versus abscess, no fluctuance.  No trismus Eyes:     Conjunctiva/sclera: Conjunctivae normal.     Pupils: Pupils are equal, round, and reactive to light.  Cardiovascular:     Rate and Rhythm: Normal rate and regular rhythm.  Pulmonary:     Effort:  Pulmonary effort is normal. No respiratory distress.  Abdominal:     General: There is no distension.     Tenderness: There is no guarding.  Musculoskeletal:        General: No deformity or signs of injury.     Cervical back: Normal range of motion and neck supple.  Skin:    Findings: No lesion or rash.  Neurological:     General: No focal deficit present.     Mental Status: He is alert. Mental status is at baseline.     ED Results / Procedures / Treatments   Labs (all labs ordered are listed, but only abnormal results are displayed) Labs Reviewed  CBC WITH DIFFERENTIAL/PLATELET - Abnormal; Notable for the following components:      Result Value   WBC 12.5 (*)    MCH 34.2 (*)    Neutro Abs 10.0 (*)    Monocytes Absolute 1.4 (*)     All other components within normal limits  BASIC METABOLIC PANEL - Abnormal; Notable for the following components:   Glucose, Bld 108 (*)    All other components within normal limits  RESP PANEL BY RT-PCR (FLU A&B, COVID) ARPGX2  GROUP A STREP BY PCR  MONONUCLEOSIS SCREEN    EKG None  Radiology No results found.  Procedures Procedures    Medications Ordered in ED Medications  clindamycin (CLEOCIN) capsule 450 mg (450 mg Oral Given 07/19/22 1009)  dexamethasone (DECADRON) tablet 8 mg (8 mg Oral Given 07/19/22 1009)  acetaminophen (TYLENOL) tablet 1,000 mg (1,000 mg Oral Given 07/19/22 1037)    ED Course/ Medical Decision Making/ A&P                           Medical Decision Making Amount and/or Complexity of Data Reviewed Labs: ordered.  Risk OTC drugs. Prescription drug management.    21 year old male presenting to the emergency department with a sore throat.  The patient states that he has had a sore throat for the last 2 days.  He denies any fevers or chills.  He is tolerating oral intake.  He is able to range his neck and able to open his mouth.  He has no known sick contacts.  He feels that the swelling is more on the left than the right.  He states that it hurts to swallow.  On arrival, the patient was vitally stable, afebrile, not tachycardic or tachypneic, hypertensive BP 165/103, saturating 100% on room air.  Patient with a physical exam concerning for left-sided peritonsillar tenderness to palpation.  Patient has no trismus, intact range of motion of the neck without pain.  Low concern for RPA at this time. Considered strep pharyngitis, COVID-19, other viral URI, developing peritonsillar abscess although no fluctuance palpated on exam.  Based on the patient's exam, do not think CT imaging of the neck is warranted at this time.  Will obtain screening labs to include mononucleosis, CBC, BMP, COVID-19 and influenza PCR testing.  Laboratory evaluation significant  for leukocytosis 12.5, negative mononucleosis screen, negative group A strep, BMP unremarkable, COVID-19 influenza PCR testing negative.  The patient was administered an additional dose of clindamycin, Decadron and Tylenol.  He was tolerating oral intake and overall well-appearing.   Discussed initial management with clindamycin and Decadron for swelling and antibiosis.  The patient has anaphylaxis to penicillins, will treat with clindamycin.  Discussed need for return for worsening symptoms as patient could eventually require abscess I&D.  Stable for discharge.     Final Clinical Impression(s) / ED Diagnoses Final diagnoses:  Tonsillitis    Rx / DC Orders ED Discharge Orders          Ordered    clindamycin (CLEOCIN) 150 MG capsule  3 times daily        07/19/22 1121              Ernie Avena, MD 07/19/22 1124

## 2022-07-19 NOTE — ED Triage Notes (Signed)
He c/o sore throat x 2 days. He is in no distress. He tells me he has no hx of hypertension.

## 2022-07-19 NOTE — Discharge Instructions (Addendum)
Your strep, COVID and influenza, mononucleosis testing was negative.  Your symptoms are concerning for tonsillitis with potential developing peritonsillar abscess.  Recommend you take antibiotics for further management.  Return to the emergency department for worsening unilateral swelling, difficulty opening your mouth or difficulty swallowing or breathing spite outpatient antibiotics.

## 2022-09-26 ENCOUNTER — Emergency Department (HOSPITAL_BASED_OUTPATIENT_CLINIC_OR_DEPARTMENT_OTHER)
Admission: EM | Admit: 2022-09-26 | Discharge: 2022-09-26 | Disposition: A | Discharge status: Home / Self Care | Payer: Medicaid Other | Attending: Emergency Medicine | Admitting: Emergency Medicine

## 2022-09-26 ENCOUNTER — Other Ambulatory Visit: Payer: Self-pay

## 2022-09-26 DIAGNOSIS — R112 Nausea with vomiting, unspecified: Secondary | ICD-10-CM | POA: Diagnosis present

## 2022-09-26 LAB — COMPREHENSIVE METABOLIC PANEL
ALT: 12 U/L (ref 0–44)
AST: 22 U/L (ref 15–41)
Albumin: 5.2 g/dL — ABNORMAL HIGH (ref 3.5–5.0)
Alkaline Phosphatase: 60 U/L (ref 38–126)
Anion gap: 11 (ref 5–15)
BUN: 11 mg/dL (ref 6–20)
CO2: 27 mmol/L (ref 22–32)
Calcium: 10 mg/dL (ref 8.9–10.3)
Chloride: 100 mmol/L (ref 98–111)
Creatinine, Ser: 1.38 mg/dL — ABNORMAL HIGH (ref 0.61–1.24)
GFR, Estimated: >60 mL/min (ref >60)
Glucose, Bld: 98 mg/dL (ref 70–99)
Potassium: 3.9 mmol/L (ref 3.5–5.1)
Sodium: 138 mmol/L (ref 135–145)
Total Bilirubin: 1.2 mg/dL (ref 0.3–1.2)
Total Protein: 8.2 g/dL — ABNORMAL HIGH (ref 6.5–8.1)

## 2022-09-26 LAB — URINALYSIS, ROUTINE W REFLEX MICROSCOPIC
Bilirubin Urine: NEGATIVE
Glucose, UA: NEGATIVE mg/dL
Hgb urine dipstick: NEGATIVE
Ketones, ur: 15 mg/dL — AB
Leukocytes,Ua: NEGATIVE
Nitrite: NEGATIVE
Protein, ur: NEGATIVE mg/dL
Specific Gravity, Urine: 1.01 (ref 1.005–1.030)
pH: 6 (ref 5.0–8.0)

## 2022-09-26 LAB — LIPASE, BLOOD: Lipase: 13 U/L (ref 11–51)

## 2022-09-26 LAB — CBC
HCT: 51.8 % (ref 39.0–52.0)
Hemoglobin: 18.1 g/dL — ABNORMAL HIGH (ref 13.0–17.0)
MCH: 34.6 pg — ABNORMAL HIGH (ref 26.0–34.0)
MCHC: 34.9 g/dL (ref 30.0–36.0)
MCV: 99 fL (ref 80.0–100.0)
Platelets: 230 10*3/uL (ref 150–400)
RBC: 5.23 MIL/uL (ref 4.22–5.81)
RDW: 12.7 % (ref 11.5–15.5)
WBC: 6.1 10*3/uL (ref 4.0–10.5)
nRBC: 0 % (ref 0.0–0.2)

## 2022-09-26 MED ORDER — ONDANSETRON HCL 4 MG PO TABS: 4.0000 mg | ORAL_TABLET | Freq: Three times a day (TID) | ORAL | 0 refills | Status: AC | PRN

## 2022-09-26 MED ORDER — SODIUM CHLORIDE 0.9 % IV BOLUS
1000.0000 mL | Freq: Once | INTRAVENOUS | Status: AC
  Administered 2022-09-26: 1000 mL via INTRAVENOUS

## 2022-09-26 MED ORDER — ONDANSETRON 4 MG PO TBDP
4.0000 mg | ORAL_TABLET | Freq: Once | ORAL | Status: AC | PRN
  Administered 2022-09-26: 4 mg via ORAL
  Filled 2022-09-26: qty 1

## 2022-09-26 NOTE — Discharge Instructions (Addendum)
You were evaluated today for nausea and vomiting.  Your workup does show some signs of possible mild dehydration.  I have prescribed Zofran for your nausea.  Please take as directed.  A work note is attached.  Please establish care with a primary care provider for follow-up as needed.  If you become unable to tolerate oral fluids even with Zofran, please return to the emergency department

## 2022-09-26 NOTE — ED Triage Notes (Signed)
Patient arrives to ED POV c/o vomiting x2 days. 

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
## 2022-10-11 ENCOUNTER — Emergency Department (HOSPITAL_BASED_OUTPATIENT_CLINIC_OR_DEPARTMENT_OTHER): Payer: Medicaid Other

## 2022-10-11 ENCOUNTER — Other Ambulatory Visit: Payer: Self-pay

## 2022-10-11 ENCOUNTER — Encounter (HOSPITAL_BASED_OUTPATIENT_CLINIC_OR_DEPARTMENT_OTHER): Payer: Self-pay | Admitting: Emergency Medicine

## 2022-10-11 ENCOUNTER — Emergency Department (HOSPITAL_BASED_OUTPATIENT_CLINIC_OR_DEPARTMENT_OTHER)
Admission: EM | Admit: 2022-10-11 | Discharge: 2022-10-11 | Disposition: A | Discharge status: Home / Self Care | Payer: Medicaid Other | Attending: Emergency Medicine | Admitting: Emergency Medicine

## 2022-10-11 DIAGNOSIS — W2201XA Walked into wall, initial encounter: Secondary | ICD-10-CM | POA: Diagnosis not present

## 2022-10-11 DIAGNOSIS — S40011A Contusion of right shoulder, initial encounter: Secondary | ICD-10-CM | POA: Diagnosis not present

## 2022-10-11 DIAGNOSIS — Y9383 Activity, rough housing and horseplay: Secondary | ICD-10-CM | POA: Insufficient documentation

## 2022-10-11 DIAGNOSIS — M25511 Pain in right shoulder: Secondary | ICD-10-CM | POA: Diagnosis present

## 2022-10-11 NOTE — ED Provider Notes (Signed)
[facility] Provider Note   CSN: [id number] Arrival date & time: 10/11/22  [id number]     History  Chief Complaint  Patient presents with   Shoulder Pain    Warren Kelley is a 22 y.o. male.  21 year old male presents with complaint of pain in the right shoulder.  Patient states that he was roughhousing with his kids and not looking while he was running, ran into a wall 3 days ago.  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  He has tried rest without improvement, has not taken anything for his pain.  No other injuries, complaints, concerns.       Home Medications Prior to Admission medications   Medication Sig Start Date End Date Taking? Authorizing Provider  cephALEXin (KEFLEX) 500 MG capsule Take 1 capsule (500 mg total) by mouth 4 (four) times daily. 01/16/22   Horton, Barbette Hair, MD  cyclobenzaprine (FLEXERIL) 10 MG tablet Take 1 tablet (10 mg total) by mouth 2 (two) times daily as needed for muscle spasms. 10/06/19   Fawze, Mina A, PA-C  dicyclomine (BENTYL) 20 MG tablet Take 1 tablet (20 mg total) by mouth 2 (two) times daily as needed (Abdominal pain). Patient not taking: Reported on 10/06/2019 02/09/19   Ward, Ozella Almond, PA-C  ibuprofen (ADVIL) 600 MG tablet Take 1 tablet (600 mg total) by mouth every 6 (six) hours as needed. 10/06/19   Nils Flack, Mina A, PA-C  ondansetron (ZOFRAN ODT) 4 MG disintegrating tablet Take 1 tablet (4 mg total) by mouth every 8 (eight) hours as needed for nausea or vomiting. Patient not taking: Reported on 10/06/2019 02/09/19   Ward, Ozella Almond, PA-C  ondansetron (ZOFRAN) 4 MG tablet Take 1 tablet (4 mg total) by mouth every 8 (eight) hours as needed for nausea or vomiting. 09/26/22   Dorothyann Peng, PA-C  promethazine (PHENERGAN) 25 MG suppository Place 1 suppository (25 mg total) rectally  every 6 (six) hours as needed for nausea or vomiting. Patient not taking: Reported on 10/06/2019 02/11/19   Deno Etienne, DO      Allergies    Penicillins, Ciprofloxacin, Fish allergy, Penicillins, and Ciprofloxacin    Review of Systems   Review of Systems Negative except as per HPI Physical Exam Updated Vital Signs BP (!) 154/98   Pulse 74   Temp 98.7 F (37.1 C) (Oral)   Resp 16   SpO2 98%  Physical Exam Vitals and nursing note reviewed.  Constitutional:      General: He is not in acute distress.    Appearance: He is well-developed. He is not diaphoretic.  HENT:     Head: Normocephalic and atraumatic.  Cardiovascular:     Pulses: Normal pulses.  Pulmonary:     Effort: Pulmonary effort is normal.  Musculoskeletal:        General: Tenderness present. No swelling or deformity. Normal range of motion.       Arms:     Comments: Tenderness noted to the lateral right clavicle without swelling or crepitus.  Also mild tenderness to right trapezius area.  Full range of motion noted.  Skin:    General: Skin is warm and dry.     Findings: No erythema or rash.  Neurological:     Mental Status: He is alert and oriented to person, place, and time.  Sensory: No sensory deficit.     Motor: No weakness.  Psychiatric:        Behavior: Behavior normal.     ED Results / Procedures / Treatments   Labs (all labs ordered are listed, but only abnormal results are displayed) Labs Reviewed - No data to display  EKG None  Radiology DG Clavicle Right  Result Date: 10/11/2022 CLINICAL DATA:  Pain after lifting EXAM: RIGHT CLAVICLE - 2+ VIEWS COMPARISON:  01/15/2022 FINDINGS: There is no evidence of fracture or other focal bone lesions. Soft tissues are unremarkable. IMPRESSION: Negative. Electronically Signed   By: Lucrezia Europe M.D.   On: 10/11/2022 10:28    Procedures Procedures    Medications Ordered in ED Medications - No data to display  ED Course/ Medical Decision Making/  A&P                             Medical Decision Making Amount and/or Complexity of Data Reviewed Radiology: ordered.   22 year old male presents with complaint of pain in his right shoulder area after running into a wall while playing with his children a few days ago, pain is worse with palpation of the area as well as range of motion, has tried rest, no OTC medications to manage his symptoms.  On exam, is found to have mild tenderness along lateral aspect of the clavicle, no significant AC space tenderness.  Does have mild tenderness in the right trapezius to the posterior shoulder.  Radial pulse present, sensation intact, normal range of motion of the shoulder.  X-ray obtained to evaluate for clavicle fracture, x-ray is ordered to rule myself is negative for fracture or other bony abnormality, agree with radiologist interpretation.  Recommend Motrin for pain as needed, provided with referral to sports medicine should pain not improve.  Provided with note for work as requested.        Final Clinical Impression(s) / ED Diagnoses Final diagnoses:  Contusion of right shoulder, initial encounter    Rx / DC Orders ED Discharge Orders     None         [name], PA-C 10/11/22 1523    [name], MD 10/15/22 [phone]

## 2022-10-11 NOTE — ED Notes (Signed)
Dc instructions reviewed with patient. Patient voiced understanding. Dc with belongings.  °

## 2022-10-11 NOTE — ED Triage Notes (Signed)
Pt lifts furniture for work, has noticed past 2-3 days right shoulder hurting and when raises his arm,"kinda locks up on him"/.

## 2022-10-11 NOTE — Discharge Instructions (Signed)
Can take Motrin and Tylenol as needed as directed for pain. Recommend warm compress for 20 minutes at a time and follow with gentle range of motion and stretching. Follow-up with orthopedics in 1 week if not improving, call to schedule an appointment.

## 2023-02-06 ENCOUNTER — Other Ambulatory Visit: Payer: Self-pay

## 2023-02-06 ENCOUNTER — Emergency Department (HOSPITAL_BASED_OUTPATIENT_CLINIC_OR_DEPARTMENT_OTHER): Payer: Medicaid Other | Admitting: Radiology

## 2023-02-06 ENCOUNTER — Emergency Department (HOSPITAL_BASED_OUTPATIENT_CLINIC_OR_DEPARTMENT_OTHER)
Admission: EM | Admit: 2023-02-06 | Discharge: 2023-02-06 | Discharge status: Against Medical Advice | Payer: Medicaid Other | Attending: Emergency Medicine | Admitting: Emergency Medicine

## 2023-02-06 ENCOUNTER — Encounter (HOSPITAL_BASED_OUTPATIENT_CLINIC_OR_DEPARTMENT_OTHER): Payer: Self-pay | Admitting: Emergency Medicine

## 2023-02-06 DIAGNOSIS — Z5321 Procedure and treatment not carried out due to patient leaving prior to being seen by health care provider: Secondary | ICD-10-CM | POA: Insufficient documentation

## 2023-02-06 DIAGNOSIS — R52 Pain, unspecified: Secondary | ICD-10-CM | POA: Insufficient documentation
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
## 2023-02-06 NOTE — ED Notes (Signed)
Pt called for room. No answer. Pt not visualized in lobby.

## 2023-02-07 ENCOUNTER — Emergency Department (HOSPITAL_BASED_OUTPATIENT_CLINIC_OR_DEPARTMENT_OTHER)
Admission: EM | Admit: 2023-02-07 | Discharge: 2023-02-07 | Disposition: A | Discharge status: Home / Self Care | Payer: Medicaid Other | Attending: Emergency Medicine | Admitting: Emergency Medicine

## 2023-02-07 DIAGNOSIS — Z1810 Retained metal fragments, unspecified: Secondary | ICD-10-CM | POA: Diagnosis not present

## 2023-02-07 DIAGNOSIS — M79621 Pain in right upper arm: Secondary | ICD-10-CM | POA: Diagnosis not present

## 2023-02-07 DIAGNOSIS — M795 Residual foreign body in soft tissue: Secondary | ICD-10-CM
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
## 2023-02-07 NOTE — ED Notes (Signed)
Dc instructions reviewed with patient. Patient voiced understanding. Dc with belongings.  °

## 2023-02-07 NOTE — Discharge Instructions (Signed)
Follow-up with the orthopedic surgeon if your symptoms become worse.  Use Tylenol and/or Motrin as directed
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
## 2024-05-26 ENCOUNTER — Emergency Department (HOSPITAL_COMMUNITY): Admission: EM | Admit: 2024-05-26 | Discharge: 2024-05-26 | Payer: Self-pay

## 2024-05-26 ENCOUNTER — Emergency Department (HOSPITAL_COMMUNITY): Payer: Self-pay

## 2024-05-26 ENCOUNTER — Encounter (HOSPITAL_COMMUNITY): Payer: Self-pay | Admitting: General Surgery

## 2024-05-26 DIAGNOSIS — T148XXA Other injury of unspecified body region, initial encounter: Secondary | ICD-10-CM

## 2024-05-26 DIAGNOSIS — Y249XXA Unspecified firearm discharge, undetermined intent, initial encounter: Secondary | ICD-10-CM

## 2024-05-26 DIAGNOSIS — S22019A Unspecified fracture of first thoracic vertebra, initial encounter for closed fracture: Secondary | ICD-10-CM | POA: Insufficient documentation

## 2024-05-26 DIAGNOSIS — I10 Essential (primary) hypertension: Secondary | ICD-10-CM | POA: Insufficient documentation

## 2024-05-26 DIAGNOSIS — S1190XA Unspecified open wound of unspecified part of neck, initial encounter: Secondary | ICD-10-CM | POA: Insufficient documentation

## 2024-05-26 DIAGNOSIS — W3400XA Accidental discharge from unspecified firearms or gun, initial encounter: Secondary | ICD-10-CM | POA: Insufficient documentation

## 2024-05-26 DIAGNOSIS — S22009A Unspecified fracture of unspecified thoracic vertebra, initial encounter for closed fracture: Secondary | ICD-10-CM

## 2024-05-26 HISTORY — DX: Essential (primary) hypertension: I10

## 2024-05-26 HISTORY — DX: Unspecified firearm discharge, undetermined intent, initial encounter: Y24.9XXA

## 2024-05-26 LAB — URINALYSIS, ROUTINE W REFLEX MICROSCOPIC
Bacteria, UA: NONE SEEN
Bilirubin Urine: NEGATIVE
Glucose, UA: NEGATIVE mg/dL
Ketones, ur: NEGATIVE mg/dL
Leukocytes,Ua: NEGATIVE
Nitrite: NEGATIVE
Protein, ur: 30 mg/dL — AB
Specific Gravity, Urine: 1.023 (ref 1.005–1.030)
pH: 5 (ref 5.0–8.0)

## 2024-05-26 LAB — I-STAT CHEM 8, ED
BUN: 8 mg/dL (ref 6–20)
Calcium, Ion: 1.06 mmol/L — ABNORMAL LOW (ref 1.15–1.40)
Chloride: 103 mmol/L (ref 98–111)
Creatinine, Ser: 1.6 mg/dL — ABNORMAL HIGH (ref 0.61–1.24)
Glucose, Bld: 109 mg/dL — ABNORMAL HIGH (ref 70–99)
HCT: 51 % (ref 39.0–52.0)
Hemoglobin: 17.3 g/dL — ABNORMAL HIGH (ref 13.0–17.0)
Potassium: 3.6 mmol/L (ref 3.5–5.1)
Sodium: 139 mmol/L (ref 135–145)
TCO2: 20 mmol/L — ABNORMAL LOW (ref 22–32)

## 2024-05-26 LAB — COMPREHENSIVE METABOLIC PANEL WITH GFR
ALT: 23 U/L (ref 0–44)
AST: 32 U/L (ref 15–41)
Albumin: 4 g/dL (ref 3.5–5.0)
Alkaline Phosphatase: 63 U/L (ref 38–126)
Anion gap: 16 — ABNORMAL HIGH (ref 5–15)
BUN: 8 mg/dL (ref 6–20)
CO2: 19 mmol/L — ABNORMAL LOW (ref 22–32)
Calcium: 8.5 mg/dL — ABNORMAL LOW (ref 8.9–10.3)
Chloride: 100 mmol/L (ref 98–111)
Creatinine, Ser: 1.39 mg/dL — ABNORMAL HIGH (ref 0.61–1.24)
GFR, Estimated: >60 mL/min (ref >60)
Glucose, Bld: 111 mg/dL — ABNORMAL HIGH (ref 70–99)
Potassium: 3.7 mmol/L (ref 3.5–5.1)
Sodium: 135 mmol/L (ref 135–145)
Total Bilirubin: 0.4 mg/dL (ref 0.0–1.2)
Total Protein: 6.9 g/dL (ref 6.5–8.1)

## 2024-05-26 LAB — CBC
HCT: 48.6 % (ref 39.0–52.0)
Hemoglobin: 16.9 g/dL (ref 13.0–17.0)
MCH: 34.3 pg — ABNORMAL HIGH (ref 26.0–34.0)
MCHC: 34.8 g/dL (ref 30.0–36.0)
MCV: 98.6 fL (ref 80.0–100.0)
Platelets: 290 K/uL (ref 150–400)
RBC: 4.93 MIL/uL (ref 4.22–5.81)
RDW: 12.5 % (ref 11.5–15.5)
WBC: 6.9 K/uL (ref 4.0–10.5)
nRBC: 0 % (ref 0.0–0.2)

## 2024-05-26 LAB — ETHANOL: Alcohol, Ethyl (B): 206 mg/dL — ABNORMAL HIGH (ref <15)

## 2024-05-26 LAB — I-STAT CG4 LACTIC ACID, ED: Lactic Acid, Venous: 3.4 mmol/L (ref 0.5–1.9)

## 2024-05-26 LAB — PROTIME-INR
INR: 1.1 (ref 0.8–1.2)
Prothrombin Time: 15.2 s (ref 11.4–15.2)

## 2024-05-26 MED ORDER — IOHEXOL 350 MG/ML SOLN
75.0000 mL | Freq: Once | INTRAVENOUS | Status: AC | PRN
Start: 2024-05-26 — End: 2024-05-26
  Administered 2024-05-26 (×2): 75 mL via INTRAVENOUS

## 2024-05-26 MED ORDER — TETANUS-DIPHTH-ACELL PERTUSSIS 5-2-15.5 LF-MCG/0.5 IM SUSP
0.5000 mL | Freq: Once | INTRAMUSCULAR | Status: AC
  Administered 2024-05-26 (×2): 0.5 mL via INTRAMUSCULAR
  Filled 2024-05-26: qty 0.5

## 2024-05-26 MED ORDER — SODIUM CHLORIDE 0.9 % IV BOLUS
1000.0000 mL | Freq: Once | INTRAVENOUS | Status: AC
  Administered 2024-05-26 (×2): 1000 mL via INTRAVENOUS
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
## 2024-05-26 NOTE — ED Notes (Signed)
 Wounds cleaned and bandage applied.
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
## 2024-05-26 NOTE — ED Notes (Signed)
 Patient Alert and oriented to baseline. Stable and ambulatory to baseline. Patient verbalized understanding of the discharge instructions.  Patient belongings were taken by the patient.
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
## 2024-05-27 ENCOUNTER — Encounter (HOSPITAL_BASED_OUTPATIENT_CLINIC_OR_DEPARTMENT_OTHER): Payer: Self-pay | Admitting: Emergency Medicine
